# Patient Record
Sex: Male | Born: 1990 | Race: Black or African American | Hispanic: No | Marital: Single | Smoking: Current every day smoker
Health system: Southern US, Community
[De-identification: ages and names within clinical notes are randomized; demographics above are authoritative.]

---

## 2010-02-27 ENCOUNTER — Emergency Department (HOSPITAL_COMMUNITY): Admission: EM | Admit: 2010-02-27 | Discharge: 2010-02-27 | Payer: Self-pay | Admitting: Emergency Medicine

## 2010-03-13 ENCOUNTER — Emergency Department (HOSPITAL_COMMUNITY): Admission: EM | Admit: 2010-03-13 | Discharge: 2010-03-13 | Payer: Self-pay | Admitting: Emergency Medicine

## 2010-03-17 ENCOUNTER — Emergency Department (HOSPITAL_COMMUNITY): Admission: EM | Admit: 2010-03-17 | Discharge: 2010-03-17 | Payer: Self-pay | Admitting: Emergency Medicine

## 2010-04-02 ENCOUNTER — Emergency Department (HOSPITAL_COMMUNITY): Admission: EM | Admit: 2010-04-02 | Discharge: 2010-04-02 | Payer: Self-pay | Admitting: Emergency Medicine

## 2010-04-03 ENCOUNTER — Emergency Department (HOSPITAL_COMMUNITY): Admission: EM | Admit: 2010-04-03 | Discharge: 2010-04-03 | Payer: Self-pay | Admitting: Emergency Medicine

## 2010-11-07 LAB — RAPID STREP SCREEN (MED CTR MEBANE ONLY): Streptococcus, Group A Screen (Direct): NEGATIVE

## 2010-11-07 LAB — URINALYSIS, ROUTINE W REFLEX MICROSCOPIC
Bilirubin Urine: NEGATIVE
Glucose, UA: NEGATIVE mg/dL
Ketones, ur: NEGATIVE mg/dL
Protein, ur: NEGATIVE mg/dL
Specific Gravity, Urine: 1.01 (ref 1.005–1.030)
Urobilinogen, UA: 0.2 mg/dL (ref 0.0–1.0)

## 2010-11-07 LAB — POCT I-STAT, CHEM 8
Calcium, Ion: 1.13 mmol/L (ref 1.12–1.32)
Creatinine, Ser: 1.1 mg/dL (ref 0.4–1.5)
Glucose, Bld: 78 mg/dL (ref 70–99)
Potassium: 4.1 mEq/L (ref 3.5–5.1)

## 2013-05-01 ENCOUNTER — Encounter (HOSPITAL_COMMUNITY): Payer: Self-pay | Admitting: Emergency Medicine

## 2013-05-01 ENCOUNTER — Emergency Department (HOSPITAL_COMMUNITY)
Admission: EM | Admit: 2013-05-01 | Discharge: 2013-05-01 | Disposition: A | Discharge status: Home / Self Care | Payer: Self-pay | Attending: Emergency Medicine | Admitting: Emergency Medicine

## 2013-05-01 ENCOUNTER — Emergency Department (HOSPITAL_COMMUNITY): Payer: Self-pay

## 2013-05-01 DIAGNOSIS — F17200 Nicotine dependence, unspecified, uncomplicated: Secondary | ICD-10-CM | POA: Insufficient documentation

## 2013-05-01 DIAGNOSIS — IMO0002 Reserved for concepts with insufficient information to code with codable children: Secondary | ICD-10-CM | POA: Insufficient documentation

## 2013-05-01 DIAGNOSIS — Y939 Activity, unspecified: Secondary | ICD-10-CM | POA: Insufficient documentation

## 2013-05-01 DIAGNOSIS — S6391XA Sprain of unspecified part of right wrist and hand, initial encounter: Secondary | ICD-10-CM

## 2013-05-01 DIAGNOSIS — Y929 Unspecified place or not applicable: Secondary | ICD-10-CM | POA: Insufficient documentation

## 2013-05-01 DIAGNOSIS — S6390XA Sprain of unspecified part of unspecified wrist and hand, initial encounter: Secondary | ICD-10-CM | POA: Insufficient documentation

## 2013-05-01 MED ORDER — IBUPROFEN 800 MG PO TABS: 800.0000 mg | ORAL_TABLET | Freq: Three times a day (TID) | ORAL | Status: DC

## 2013-05-01 NOTE — ED Provider Notes (Signed)
CSN: [id number]     Arrival date & time 05/01/13  1424 History  This chart was scribed for non-physician practitioner Roxy Horseman, PA-C working with Gavin Pound. Oletta Lamas, MD by Danella Maiers, ED Scribe. This patient was seen in room TR09C/TR09C and the patient's care was started at 2:59 PM.    Chief Complaint  Patient presents with  . Hand Pain   The history is provided by the patient. No language interpreter was used.   HPI Comments: Erik Watkins is a 22 y.o. male who presents to the Emergency Department complaining of constant right hand pain onset 1 month ago when he hit his hand on the wall. He applied ice immediately after and it felt better but he states he is still experiencing soreness and notices he favors the right hand while driving.   History reviewed. No pertinent past medical history. History reviewed. No pertinent past surgical history. History reviewed. No pertinent family history. History  Substance Use Topics  . Smoking status: Current Every Day Smoker  . Smokeless tobacco: Not on file  . Alcohol Use: Yes    Review of Systems  All other systems reviewed and are negative.    Allergies  Review of patient's allergies indicates no known allergies.  Home Medications  No current outpatient prescriptions on file. BP 108/61  Pulse 85  Temp(Src) 98.3 F (36.8 C) (Oral)  Resp 16  SpO2 97% Physical Exam  Nursing note and vitals reviewed. Constitutional: He is oriented to person, place, and time. He appears well-developed and well-nourished. No distress.  HENT:  Head: Normocephalic and atraumatic.  Eyes: EOM are normal.  Neck: Neck supple. No tracheal deviation present.  Cardiovascular: Normal rate and intact distal pulses.   brisk capillary refill  Pulmonary/Chest: Effort normal. No respiratory distress.  Musculoskeletal: Normal range of motion.  ROM and strength 5/5. Mild tenderness to palpation over 2nd MCP. No bony abnormality or deformity.  Neurological:  He is alert and oriented to person, place, and time.  Sensation and strength intact.  Skin: Skin is warm and dry.  Psychiatric: He has a normal mood and affect. His behavior is normal.    ED Course  Procedures (including critical care time) Medications - No data to display  DIAGNOSTIC STUDIES: Oxygen Saturation is 97% on room air , normal by my interpretation.    COORDINATION OF CARE: 3:28 PM- Discussed treatment plan with pt which includes hand xray and pt agrees to plan.    Results for orders placed during the hospital encounter of 03/17/10  RAPID STREP SCREEN      Result Value Range   Streptococcus, Group A Screen (Direct) NEGATIVE  NEGATIVE  URINALYSIS, ROUTINE W REFLEX MICROSCOPIC      Result Value Range   Color, Urine YELLOW  YELLOW   APPearance CLEAR  CLEAR   Specific Gravity, Urine 1.010  1.005 - 1.030   pH 5.5  5.0 - 8.0   Glucose, UA NEGATIVE  NEGATIVE mg/dL   Hgb urine dipstick NEGATIVE  NEGATIVE   Bilirubin Urine NEGATIVE  NEGATIVE   Ketones, ur NEGATIVE  NEGATIVE mg/dL   Protein, ur NEGATIVE  NEGATIVE mg/dL   Urobilinogen, UA 0.2  0.0 - 1.0 mg/dL   Nitrite NEGATIVE  NEGATIVE   Leukocytes, UA    NEGATIVE   Value: NEGATIVE MICROSCOPIC NOT DONE ON URINES WITH NEGATIVE PROTEIN, BLOOD, LEUKOCYTES, NITRITE, OR GLUCOSE <1000 mg/dL.  POCT I-STAT, CHEM 8      Result Value Range   Sodium 141  135 - 145 mEq/L   Potassium 4.1  3.5 - 5.1 mEq/L   Chloride 105  96 - 112 mEq/L   BUN 8  6 - 23 mg/dL   Creatinine, Ser 1.1  0.4 - 1.5 mg/dL   Glucose, Bld 78  70 - 99 mg/dL   Calcium, Ion 1.61  0.96 - 1.32 mmol/L   TCO2 28  0 - 100 mmol/L   Hemoglobin 15.3  13.0 - 17.0 g/dL   HCT 04.5  40.9 - 81.1 %   Dg Hand Complete Right  05/01/2013   *RADIOLOGY REPORT*  Clinical Data: 1 month history of hand pain at right second MCP joint  RIGHT HAND - COMPLETE 3+ VIEW  Comparison: None  Findings: Three views of the right hand demonstrate no acute fracture, malalignment or focal soft  tissue swelling.  Normal bony mineralization.  No periosteal reaction or lytic or blastic osseous lesion.  IMPRESSION: Negative radiographs of the right hand   Original Report Authenticated By: Malachy Moan, M.D.     MDM   1. Hand sprain, right, initial encounter     Right hand sprain, recommended RICE, NSAIDs, and hand follow-up.  Patient has FROM and 5/5 strength.  Good pulses and cap refill.  Ortho follow-up    I personally performed the services described in this documentation, which was scribed in my presence. The recorded information has been reviewed and is accurate.     Roxy Horseman, PA-C 05/01/13 1805

## 2013-05-01 NOTE — ED Notes (Signed)
Pt c/o right hand pain x 1.5 months after hitting hand on wall

## 2013-05-03 NOTE — ED Provider Notes (Signed)
Medical screening examination/treatment/procedure(s) were performed by non-physician practitioner and as supervising physician I was immediately available for consultation/collaboration.  Gavin Pound. Oletta Lamas, MD 05/03/13 2159

## 2014-11-11 IMAGING — CR DG HAND COMPLETE 3+V*R*
3 series · 3 of 3 positions shown · non-contrast
Comparison: None

CLINICAL DATA: 1 month history of hand pain at right second MCP
joint

RIGHT HAND - COMPLETE 3+ VIEW

[x hand pa right]
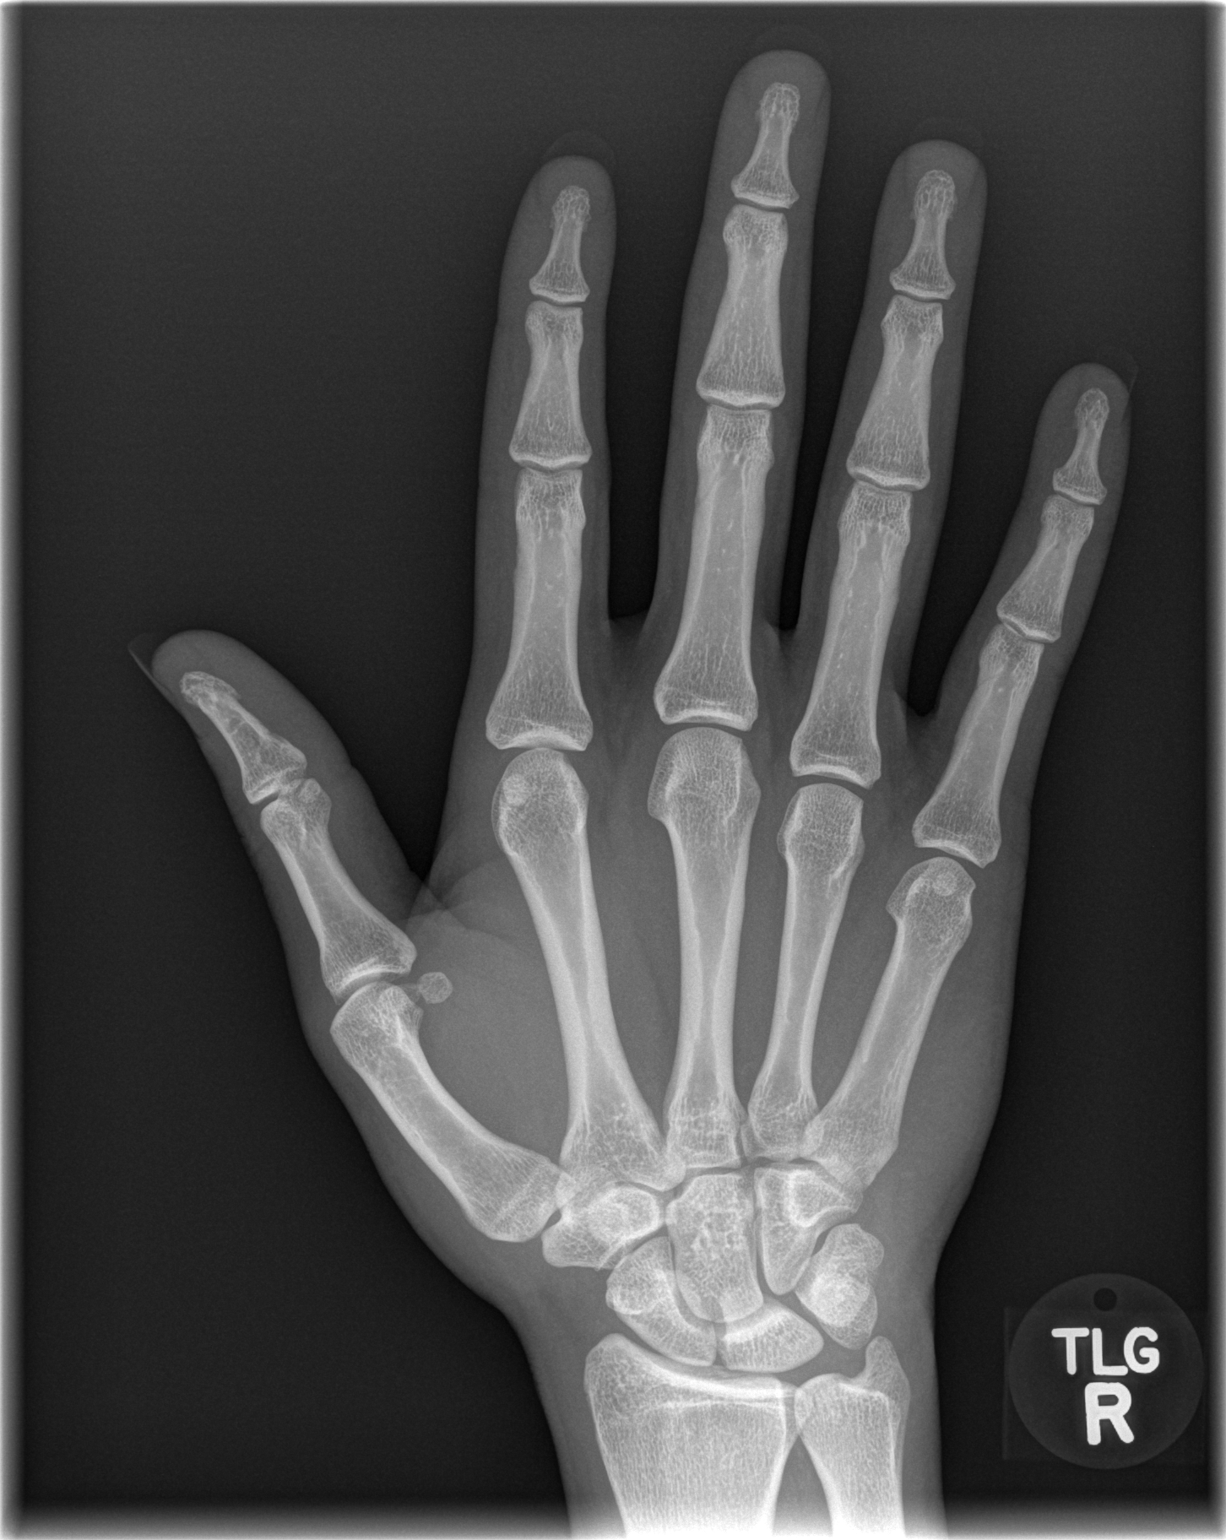

[x hand oblique right]
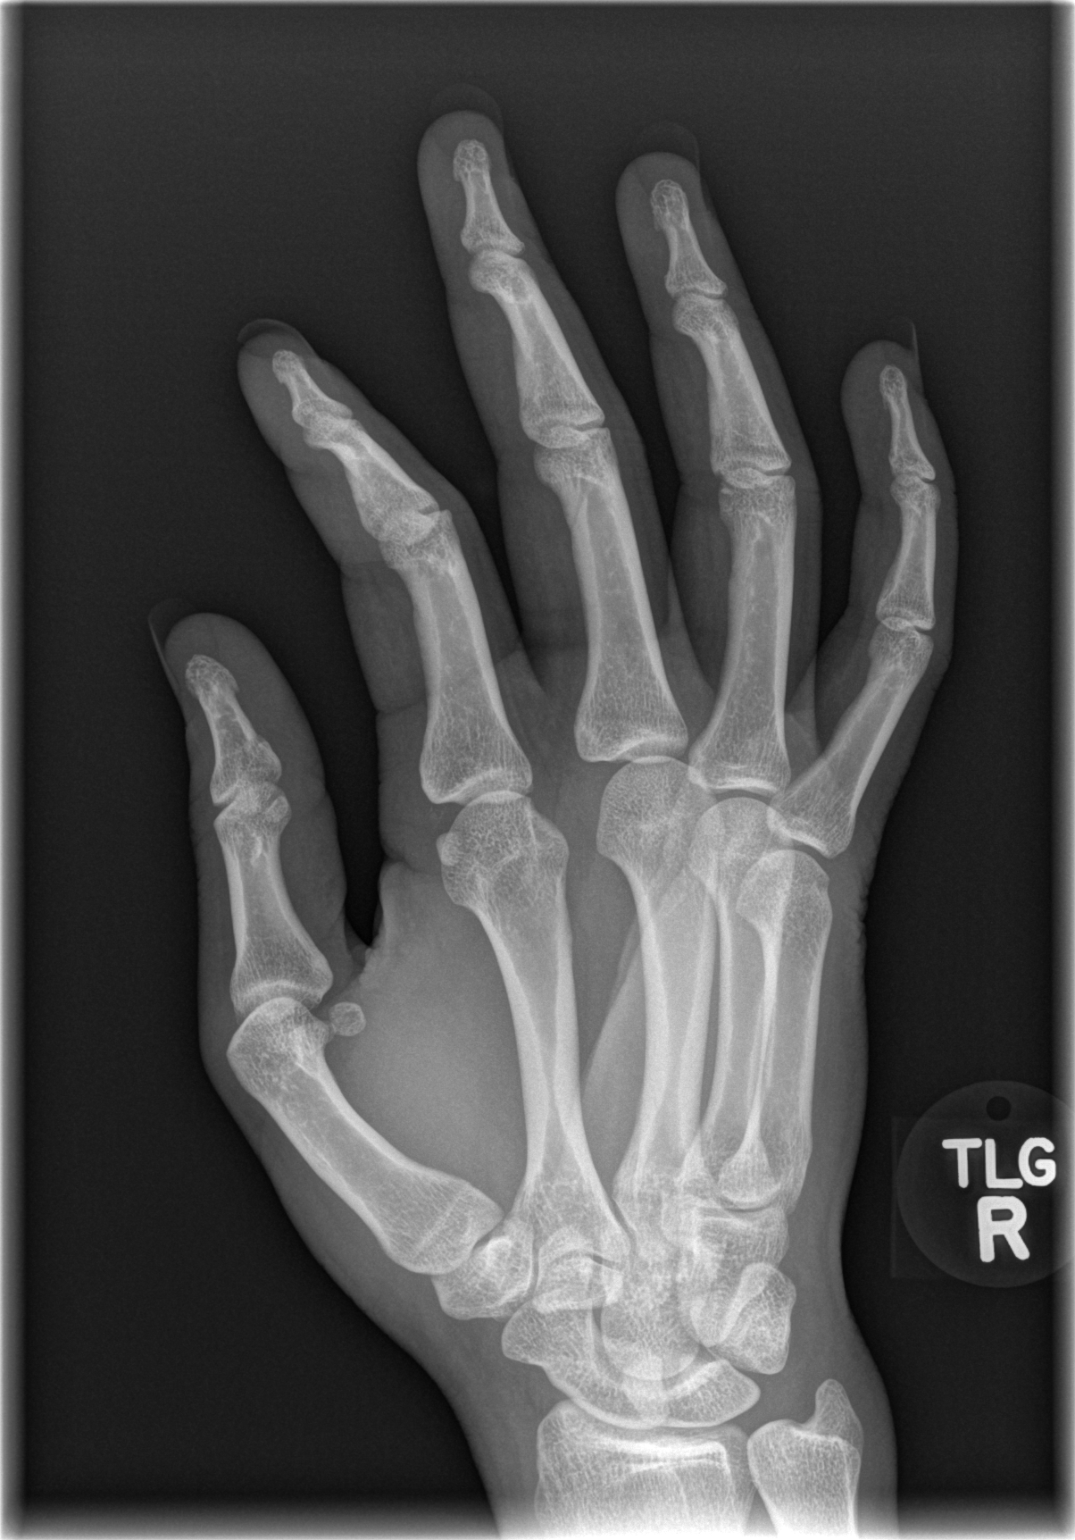

[x hand lat right]
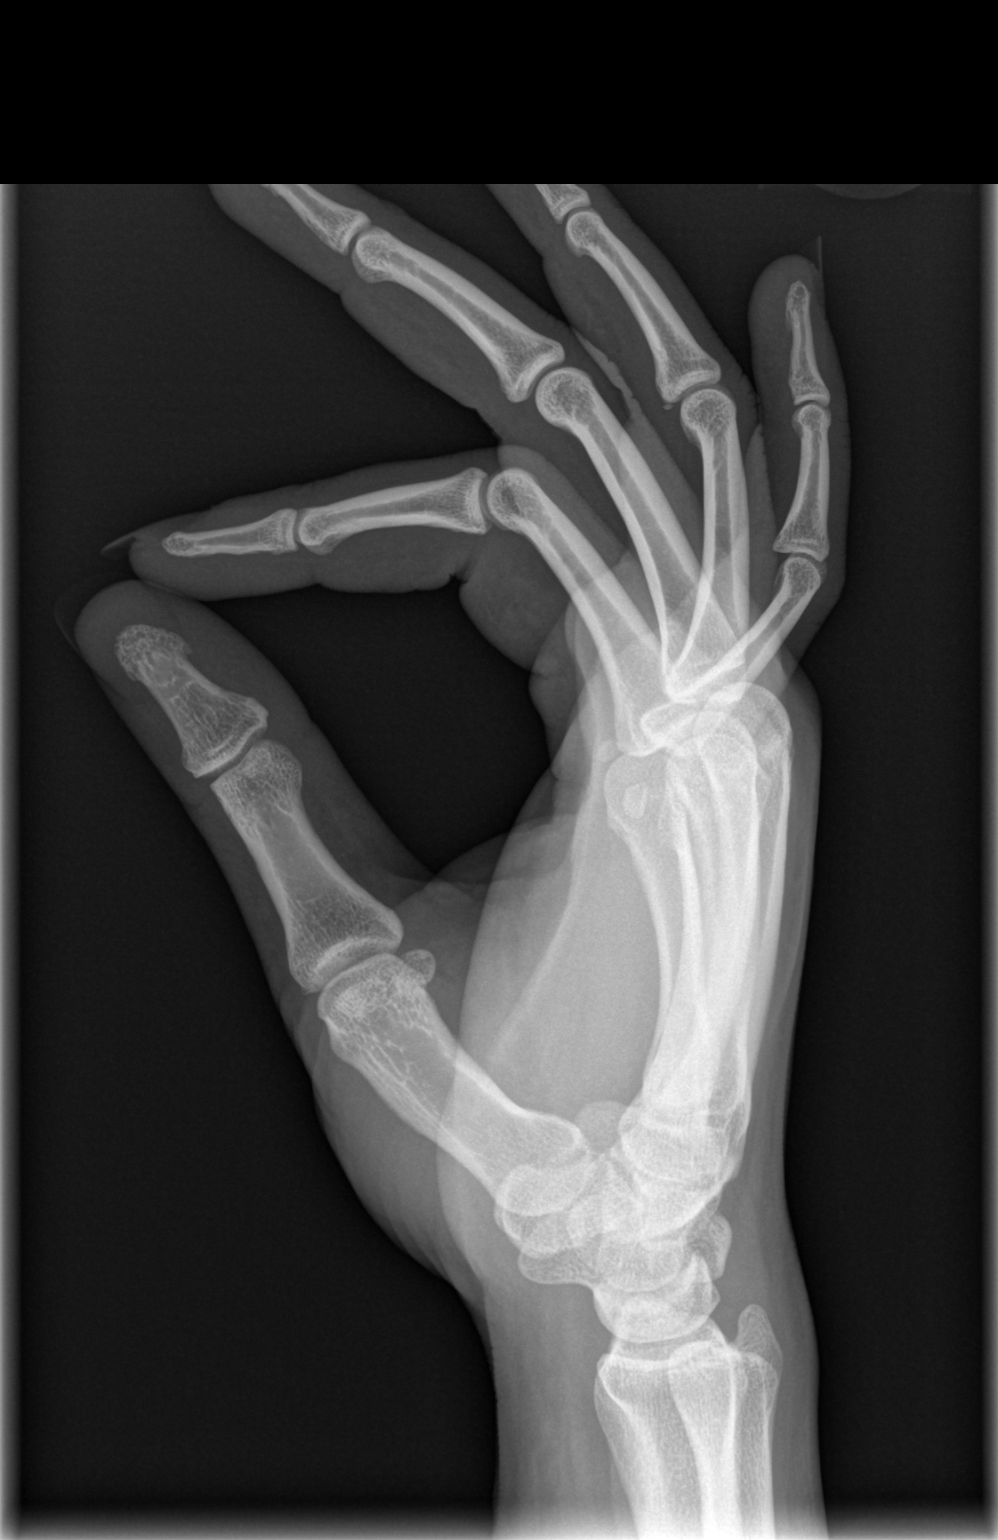

[3 of 3 positions shown; findings below may reference images not displayed]

FINDINGS: Three views of the right hand demonstrate no acute
fracture, malalignment or focal soft tissue swelling.  Normal bony
mineralization.  No periosteal reaction or lytic or blastic osseous
lesion.
IMPRESSION: Negative radiographs of the right hand

## 2015-04-01 DIAGNOSIS — M25511 Pain in right shoulder: Secondary | ICD-10-CM | POA: Insufficient documentation

## 2015-04-01 DIAGNOSIS — Z720 Tobacco use: Secondary | ICD-10-CM | POA: Insufficient documentation

## 2015-04-02 ENCOUNTER — Encounter (HOSPITAL_COMMUNITY): Payer: Self-pay | Admitting: *Deleted

## 2015-04-02 ENCOUNTER — Emergency Department (HOSPITAL_COMMUNITY)
Admission: EM | Admit: 2015-04-02 | Discharge: 2015-04-02 | Disposition: A | Discharge status: Home / Self Care | Payer: Self-pay | Attending: Emergency Medicine | Admitting: Emergency Medicine

## 2015-04-02 DIAGNOSIS — M25511 Pain in right shoulder: Secondary | ICD-10-CM

## 2015-04-02 NOTE — ED Notes (Signed)
Patient presents with right shoulder pain  Stated he was playing basketball earlier and was playing with his children swinging them around and hurt his shoulder

## 2015-04-02 NOTE — ED Provider Notes (Signed)
CSN: [id number]     Arrival date & time 04/01/15  2353 History   None    Chief Complaint  Patient presents with  . Shoulder Pain   HPI   24 year old male presents today with right shoulder pain. She reports that he was playing basketball this afternoon, followed by playing with his son. He reports he was swinging his son who is proximally 45 pounds. He reports shortly after that he started to develop soreness and pain in his right shoulder. He attempted symptomatic relief with a heating pad and this caused her significant pain. He reports difficulty with all range of motion of the shoulder. He reports full strength and sensation in the distal extremity. No history of the same. Patient has not tried any over-the-counter therapies.    History reviewed. No pertinent past medical history. History reviewed. No pertinent past surgical history. No family history on file. Social History  Substance Use Topics  . Smoking status: Current Every Day Smoker  . Smokeless tobacco: Never Used  . Alcohol Use: Yes    Review of Systems  All other systems reviewed and are negative.   Allergies  Review of patient's allergies indicates no known allergies.  Home Medications   Prior to Admission medications   Medication Sig Start Date End Date Taking? Authorizing Provider  ibuprofen (ADVIL,MOTRIN) 800 MG tablet Take 1 tablet (800 mg total) by mouth 3 (three) times daily. 05/01/13   Roxy Horseman, PA-C   BP 106/60 mmHg  Pulse 83  Temp(Src) 98 F (36.7 C) (Oral)  Resp 16  Ht 6' (1.829 m)  Wt 202 lb (91.627 kg)  BMI 27.39 kg/m2  SpO2 98%    Physical Exam  Constitutional: He is oriented to person, place, and time. He appears well-developed and well-nourished.  HENT:  Head: Normocephalic and atraumatic.  Eyes: Conjunctivae are normal. Pupils are equal, round, and reactive to light. Right eye exhibits no discharge. Left eye exhibits no discharge. No scleral icterus.  Neck: Normal range of motion. No  JVD present. No tracheal deviation present.  Cardiovascular: Normal rate.   Pulmonary/Chest: Effort normal. No stridor.  Musculoskeletal:  Patient has tenderness to palpation of the right anterior shoulder. He has pain with all range of motion. No significant swelling or edema, no signs of infection. Sensation grossly intact in distal extremity, radial pulse 2+, Refill less than 3 seconds, grip strength 5 out of 5.   Neurological: He is alert and oriented to person, place, and time. Coordination normal.  Psychiatric: He has a normal mood and affect. His behavior is normal. Judgment and thought content normal.  Nursing note and vitals reviewed.   ED Course  Procedures (including critical care time) Labs Review Labs Reviewed - No data to display  Imaging Review No results found.   EKG Interpretation None      MDM   Final diagnoses:  Right shoulder pain    Labs:   Imaging:  Consults:  Therapeutics:  Discharge Meds:   Assessment/Plan: She presents today with right shoulder pain, likely muscular in nature. I was unable to complete a full shoulder exam due to pain, no swelling, tenderness to palpation. Distal sensation and strength or perfusion intact. Patient was given a short course of narcotic pain medications instructions to use ibuprofen and Tylenol as needed for pain. Ice, rest, follow up with primary care provider in 3-5 days for reevaluation. Patient verbalized understanding and agreement to today's plan, he was given strict return precautions.  Eyvonne Mechanic, PA-C 04/02/15 1453  Tomasita Crumble, MD 04/02/15 [phone]

## 2019-07-18 ENCOUNTER — Emergency Department (HOSPITAL_COMMUNITY)
Admission: EM | Admit: 2019-07-18 | Discharge: 2019-07-18 | Disposition: A | Discharge status: Home / Self Care | Payer: Self-pay | Attending: Emergency Medicine | Admitting: Emergency Medicine

## 2019-07-18 ENCOUNTER — Encounter (HOSPITAL_COMMUNITY): Payer: Self-pay | Admitting: Emergency Medicine

## 2019-07-18 ENCOUNTER — Other Ambulatory Visit: Payer: Self-pay

## 2019-07-18 DIAGNOSIS — Z791 Long term (current) use of non-steroidal anti-inflammatories (NSAID): Secondary | ICD-10-CM | POA: Insufficient documentation

## 2019-07-18 DIAGNOSIS — K0889 Other specified disorders of teeth and supporting structures: Secondary | ICD-10-CM | POA: Insufficient documentation

## 2019-07-18 DIAGNOSIS — F17210 Nicotine dependence, cigarettes, uncomplicated: Secondary | ICD-10-CM | POA: Insufficient documentation

## 2019-07-18 MED ORDER — PENICILLIN V POTASSIUM 250 MG PO TABS
500.0000 mg | ORAL_TABLET | Freq: Once | ORAL | Status: AC
  Administered 2019-07-18: 01:00:00 500 mg via ORAL
  Filled 2019-07-18: qty 2

## 2019-07-18 MED ORDER — IBUPROFEN 400 MG PO TABS
600.0000 mg | ORAL_TABLET | Freq: Once | ORAL | Status: AC
  Administered 2019-07-18: 600 mg via ORAL
  Filled 2019-07-18: qty 1

## 2019-07-18 MED ORDER — OXYCODONE-ACETAMINOPHEN 5-325 MG PO TABS
1.0000 | ORAL_TABLET | Freq: Once | ORAL | Status: AC
  Administered 2019-07-18: 1 via ORAL
  Filled 2019-07-18: qty 1

## 2019-07-18 MED ORDER — IBUPROFEN 600 MG PO TABS: 600.0000 mg | ORAL_TABLET | Freq: Four times a day (QID) | ORAL | 0 refills | Status: AC | PRN

## 2019-07-18 MED ORDER — PENICILLIN V POTASSIUM 500 MG PO TABS: 500.0000 mg | ORAL_TABLET | Freq: Three times a day (TID) | ORAL | 0 refills | Status: AC

## 2019-07-18 NOTE — Discharge Instructions (Signed)
Please follow up with a dentist of your choice for further dental care. Take medications as prescribed.

## 2019-07-18 NOTE — ED Notes (Signed)
Patient verbalizes understanding of discharge instructions. Opportunity for questioning and answers were provided. Armband removed by staff, pt discharged from ED. Pt. ambulatory and discharged home.  

## 2019-07-18 NOTE — ED Provider Notes (Signed)
[facility] DEPART[facility] Provider Note   CSN: [id number] Arrival date & time: 07/18/19  0011     History   Chief Complaint Chief Complaint  Patient presents with  . Dental Pain    HPI Erik Watkins is a 28 y.o. male.     Patient to ED with pain in upper left molar for the past several days since he noted a break in the tooth. No facial swelling or fever. No difficulty swallowing. He has not contacted a dentist. No nausea or vomiting.  The history is provided by the patient. No language interpreter was used.  Dental Pain Location:  Upper Upper teeth location:  16/LU 3rd molar Quality:  Aching, sharp and pulsating Severity:  Moderate Associated symptoms: no facial swelling, no fever and no headaches     History reviewed. No pertinent past medical history.  There are no active problems to display for this patient.   History reviewed. No pertinent surgical history.      Home Medications    Prior to Admission medications   Medication Sig Start Date End Date Taking? Authorizing Provider  ibuprofen (ADVIL,MOTRIN) 800 MG tablet Take 1 tablet (800 mg total) by mouth 3 (three) times daily. 05/01/13   Montine Circle, PA-C    Family History No family history on file.  Social History Social History   Tobacco Use  . Smoking status: Current Every Day Smoker  . Smokeless tobacco: Never Used  Substance Use Topics  . Alcohol use: Yes  . Drug use: Yes    Types: Marijuana     Allergies   Patient has no known allergies.   Review of Systems Review of Systems  Constitutional: Negative for fever.  HENT: Positive for dental problem. Negative for facial swelling and sore throat.   Gastrointestinal: Negative for nausea.  Neurological: Negative for headaches.     Physical Exam Updated Vital Signs BP 130/78 (BP Location: Right Arm)   Pulse 79   Temp 98 F (36.7 C) (Oral)   Resp 18   Ht (!) 11" (0.279 m)   Wt 99.8 kg   SpO2 99%   BMI  1278.32 kg/m   Physical Exam Constitutional:      Appearance: Normal appearance.  HENT:     Head:     Comments: No facial swelling.    Mouth/Throat:     Comments: Upper rear left molar with missing outer fragment. No visible abscess. Oropharynx benign. Lymphadenopathy:     Head:     Right side of head: No submental or submandibular adenopathy.     Left side of head: No submental or submandibular adenopathy.     Cervical:     Right cervical: No superficial cervical adenopathy.    Left cervical: No superficial cervical adenopathy.  Neurological:     Mental Status: He is alert.      ED Treatments / Results  Labs (all labs ordered are listed, but only abnormal results are displayed) Labs Reviewed - No data to display  EKG None  Radiology No results found.  Procedures Procedures (including critical care time)  Medications Ordered in ED Medications - No data to display   Initial Impression / Assessment and Plan / ED Course  I have reviewed the triage vital signs and the nursing notes.  Pertinent labs & imaging results that were available during my care of the patient were reviewed by me and considered in my medical decision making (see chart for details).  Patient to ED with left upper dental pain since a piece broke off several days ago.   Will cover with abx, ibuprofen, dental referral. No drainable abscess identified.   Final Clinical Impressions(s) / ED Diagnoses   Final diagnoses:  None   1. Dental pain 2. Tooth fracture  ED Discharge Orders    None       Elpidio Anis, PA-C 07/18/19 0118    Nira Conn, MD 07/18/19 [phone]

## 2019-07-18 NOTE — ED Triage Notes (Signed)
Pt c/o pain in left upper back tooth   St's he broke the tooth 1 week ago.  Pt has not taken any OTC meds for pain
